# Patient Record
Sex: Male | Born: 1985 | Race: White | Hispanic: No | Marital: Married | Smoking: Never smoker
Health system: Southern US, Community
[De-identification: ages and names within clinical notes are randomized; demographics above are authoritative.]

## PROBLEM LIST (undated history)

## (undated) DIAGNOSIS — K2970 Gastritis, unspecified, without bleeding: Secondary | ICD-10-CM

## (undated) DIAGNOSIS — T7840XA Allergy, unspecified, initial encounter: Secondary | ICD-10-CM

## (undated) DIAGNOSIS — D649 Anemia, unspecified: Secondary | ICD-10-CM

## (undated) HISTORY — DX: Anemia, unspecified: D64.9

## (undated) HISTORY — DX: Gastritis, unspecified, without bleeding: K29.70

## (undated) HISTORY — DX: Allergy, unspecified, initial encounter: T78.40XA

## (undated) HISTORY — PX: ADENOIDECTOMY: SUR15

---

## 2019-07-20 ENCOUNTER — Emergency Department (HOSPITAL_COMMUNITY): Payer: Self-pay | Attending: Student

## 2019-07-20 ENCOUNTER — Other Ambulatory Visit: Payer: Self-pay

## 2019-07-20 ENCOUNTER — Emergency Department (HOSPITAL_COMMUNITY): Payer: No Typology Code available for payment source

## 2019-07-20 ENCOUNTER — Encounter (HOSPITAL_COMMUNITY): Payer: Self-pay | Admitting: Emergency Medicine

## 2019-07-20 ENCOUNTER — Emergency Department (HOSPITAL_COMMUNITY)
Admission: EM | Admit: 2019-07-20 | Discharge: 2019-07-21 | Disposition: A | Discharge status: Home / Self Care | Payer: No Typology Code available for payment source | Attending: Emergency Medicine | Admitting: Emergency Medicine

## 2019-07-20 DIAGNOSIS — W010XXA Fall on same level from slipping, tripping and stumbling without subsequent striking against object, initial encounter: Secondary | ICD-10-CM | POA: Diagnosis not present

## 2019-07-20 DIAGNOSIS — Y9389 Activity, other specified: Secondary | ICD-10-CM | POA: Diagnosis not present

## 2019-07-20 DIAGNOSIS — S52122A Displaced fracture of head of left radius, initial encounter for closed fracture: Secondary | ICD-10-CM | POA: Diagnosis not present

## 2019-07-20 DIAGNOSIS — Y929 Unspecified place or not applicable: Secondary | ICD-10-CM | POA: Diagnosis not present

## 2019-07-20 DIAGNOSIS — S59912A Unspecified injury of left forearm, initial encounter: Secondary | ICD-10-CM | POA: Diagnosis present

## 2019-07-20 DIAGNOSIS — R52 Pain, unspecified: Secondary | ICD-10-CM

## 2019-07-20 DIAGNOSIS — Y998 Other external cause status: Secondary | ICD-10-CM | POA: Insufficient documentation

## 2019-07-20 MED ORDER — HYDROCODONE-ACETAMINOPHEN 5-325 MG PO TABS: 1.0000 | ORAL_TABLET | Freq: Four times a day (QID) | ORAL | 0 refills | Status: DC | PRN

## 2019-07-20 MED ORDER — HYDROCODONE-ACETAMINOPHEN 5-325 MG PO TABS
1.0000 | ORAL_TABLET | Freq: Once | ORAL | Status: AC
  Administered 2019-07-21: 1 via ORAL
  Filled 2019-07-20: qty 1

## 2019-07-20 NOTE — Discharge Instructions (Addendum)
Take ibuprofen 3 times a day with meals.  Do not take other anti-inflammatories at the same time (Advil, Motrin, naproxen, Aleve). You may supplement with Tylenol if you need further pain control. Use ice packs, 20 minutes at a time, 3 times a day.  Take norco as needed for severe or break through pain. Have caution, this may make you tired or groggy. Do not driver or operate heavy machinery.  Follow up with the clinic listed below.

## 2019-07-20 NOTE — ED Triage Notes (Signed)
Pt c/o left elbow pain and concerned for dislocation after falling at work and trying to catch himself.

## 2019-07-21 NOTE — ED Notes (Signed)
Patient verbalizes understanding of discharge instructions. Opportunity for questioning and answers were provided. Armband removed by staff, pt discharged from ED.  

## 2019-07-21 NOTE — ED Provider Notes (Signed)
[facility] EMERGENCY DEPARTMENT Provider Note   CSN: [id number] Arrival date & time: 07/20/19  2051     History   Chief Complaint Chief Complaint  Patient presents with  . Arm Injury    HPI Kirk James is a 33 y.o. male presenting for evaluation of L elbow and wrist pain.   Patient states around 8:00 tonight he tripped backwards, and stretched his left arm backwards to catch himself.  Since then, he has been having pain of his left elbow and at the base of his left thumb on the palmar aspect.  He denies pain elsewhere.  He denies numbness or tingling.  Pain is mild in his hand/wrist, worse in his elbow.  He has not taken anything for pain including Tylenol ibuprofen.  He denies injury elsewhere.  He denies hitting his head or loss of consciousness.  He is right-handed.  He has no medical problems, takes no medications daily.  He has never seen an orthopedic doctor before.     HPI  History reviewed. No pertinent past medical history.  There are no active problems to display for this patient.   History reviewed. No pertinent surgical history.      Home Medications    Prior to Admission medications   Medication Sig Start Date End Date Taking? Authorizing Provider  HYDROcodone-acetaminophen (NORCO/VICODIN) 5-325 MG tablet Take 1 tablet by mouth every 6 (six) hours as needed for severe pain. 07/20/19   Mattheo Swindle, PA-C    Family History No family history on file.  Social History Social History   Tobacco Use  . Smoking status: Never Smoker  . Smokeless tobacco: Never Used  Substance Use Topics  . Alcohol use: Yes  . Drug use: Never     Allergies   Patient has no known allergies.   Review of Systems Review of Systems  Musculoskeletal: Positive for arthralgias and joint swelling.     Physical Exam Updated Vital Signs BP (!) 143/78 (BP Location: Right Arm)   Pulse 70   Temp 98.8 F (37.1 C) (Oral)   Ht 5\' 9"  (1.753 m)   Wt 90.7  kg   SpO2 96%   BMI 29.53 kg/m   Physical Exam Vitals signs and nursing note reviewed.  Constitutional:      General: He is not in acute distress.    Appearance: He is well-developed.  HENT:     Head: Normocephalic and atraumatic.  Neck:     Musculoskeletal: Normal range of motion.  Pulmonary:     Effort: Pulmonary effort is normal.  Abdominal:     General: There is no distension.  Musculoskeletal:        General: Swelling and tenderness present.       Hands:     Comments: Mild swelling of the left elbow.  Tenderness palpation of the entire elbow joint.  No erythema or warmth.  No tenderness palpation of the forearm.  Tenderness palpation at the base of the left thumb, over the thenar eminence.  No tenderness palpation of the dorsal thumb or pain at the anatomic snuffbox.  Full active range of motion of the thumb with mild discomfort.  Radial pulses intact bilaterally.  Skin:    General: Skin is warm.     Findings: No rash.  Neurological:     Mental Status: He is alert and oriented to person, place, and time.      ED Treatments / Results  Labs (all labs ordered are listed, but  only abnormal results are displayed) Labs Reviewed - No data to display  EKG None  Radiology Dg Elbow Complete Left  Result Date: 07/20/2019 CLINICAL DATA:  Fall.  Elbow pain EXAM: LEFT ELBOW - COMPLETE 3+ VIEW COMPARISON:  None. FINDINGS: Fracture of the radial head with mild displacement. Large joint effusion. No other fracture. IMPRESSION: Radial head fracture Electronically Signed   By: Marlan Palau M.D.   On: 07/20/2019 21:26   Dg Wrist Complete Right  Addendum Date: 07/20/2019   ADDENDUM REPORT: 07/20/2019 23:25 ADDENDUM: The examination is actually of the left wrist. Electronically Signed   By: Deatra Robinson M.D.   On: 07/20/2019 23:25   Result Date: 07/20/2019 CLINICAL DATA:  Wrist pain after falling EXAM: RIGHT WRIST - COMPLETE 3+ VIEW COMPARISON:  None. FINDINGS: There is no evidence  of fracture or dislocation. There is no evidence of arthropathy or other focal bone abnormality. Soft tissues are unremarkable. IMPRESSION: Negative. Electronically Signed: By: Deatra Robinson M.D. On: 07/20/2019 22:52    Procedures Procedures (including critical care time)  Medications Ordered in ED Medications  HYDROcodone-acetaminophen (NORCO/VICODIN) 5-325 MG per tablet 1 tablet (has no administration in time range)     Initial Impression / Assessment and Plan / ED Course  I have reviewed the triage vital signs and the nursing notes.  Pertinent labs & imaging results that were available during my care of the patient were reviewed by me and considered in my medical decision making (see chart for details).        Pt presenting for evaluation of L elbow and wrist pain s/p fall.  Physical exam reassuring, he is neurovascularly intact.  However, pain of the elbow and at the base of the right thumb.  As such, will obtain x-rays of both.  X-rays viewed interpreted by me, shows nondisplaced radial head fracture.  No abnormality of the wrist.  Of note, right wrist x-ray was ordered, however left wrist imaging was performed.  This is where the patient was injured, as such we will order name is incorrect with the image is of the correct side.  As she is mildly displaced, will consult with orthopedics, however patient can likely be discharged.  Discussed with Dr. Charlann Boxer from orthopedics who recommends sling and pain control, and follow-up with the hand surgeons in his group.  Discussed findings and plan with patient, who is agreeable.  PMP checked, patient without concerning controlled substance use. Will give norco. F/u with ortho. At this time, pt appears safe for d/c. Return precautions given. Pt states he understands and agrees to plan.   Final Clinical Impressions(s) / ED Diagnoses   Final diagnoses:  Closed displaced fracture of head of left radius, initial encounter    ED Discharge Orders          Ordered    HYDROcodone-acetaminophen (NORCO/VICODIN) 5-325 MG tablet  Every 6 hours PRN     07/20/19 2345           Rasheena Talmadge, PA-C 07/21/19 0122    Melene Plan, DO 07/21/19 1521

## 2020-03-05 IMAGING — DX DG WRIST COMPLETE 3+V*R*
4 series · 4 of 4 positions shown · non-contrast
Comparison: None.
COMPARISON: None.

Addendum:
CLINICAL DATA: Wrist pain after falling

EXAM:
RIGHT WRIST - COMPLETE 3+ VIEW

[wrist pa]
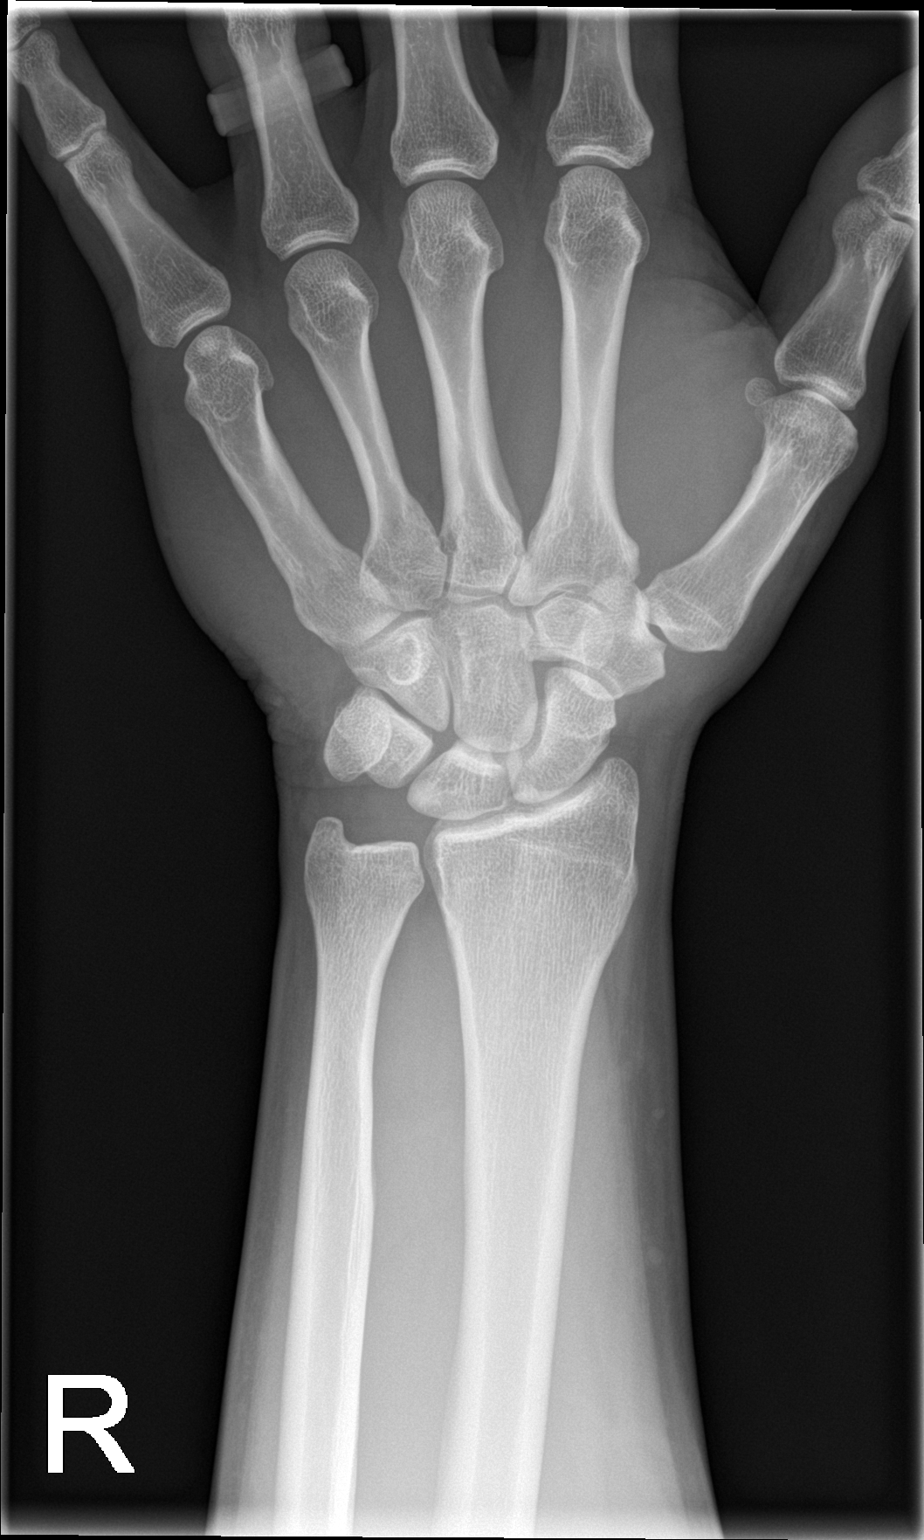

[wrist obl]
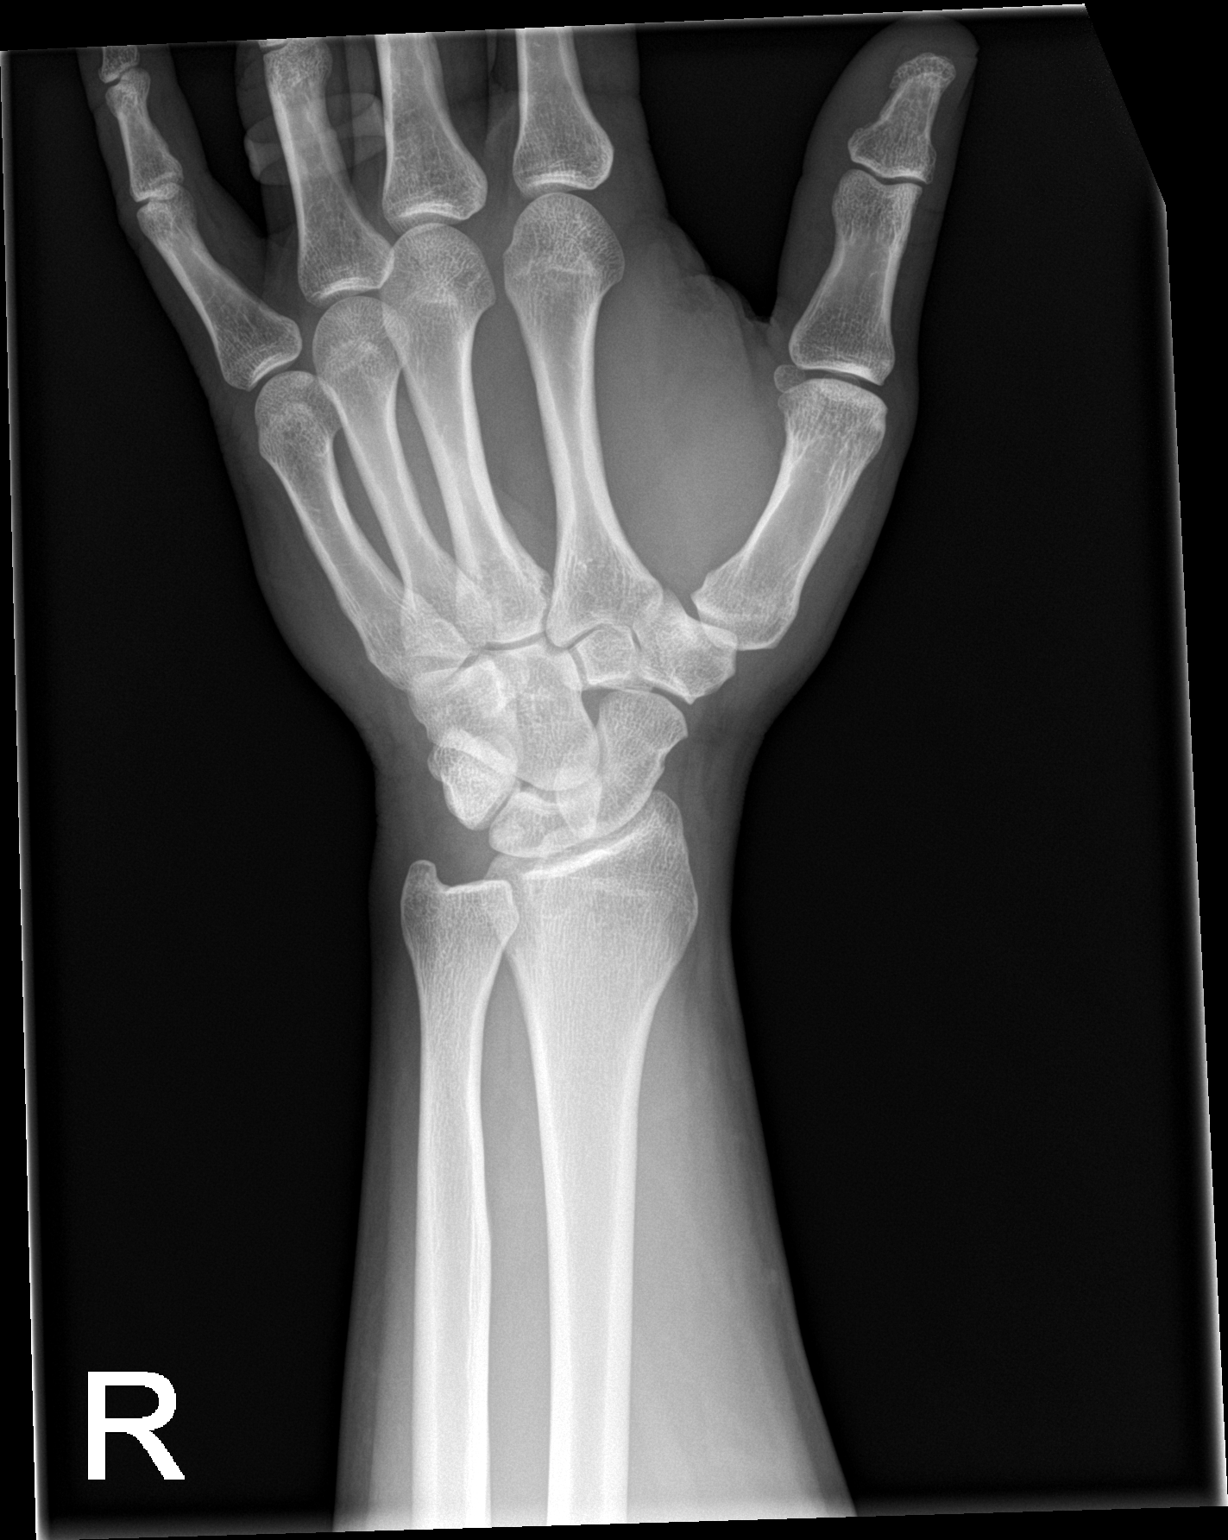

[wrist lat]
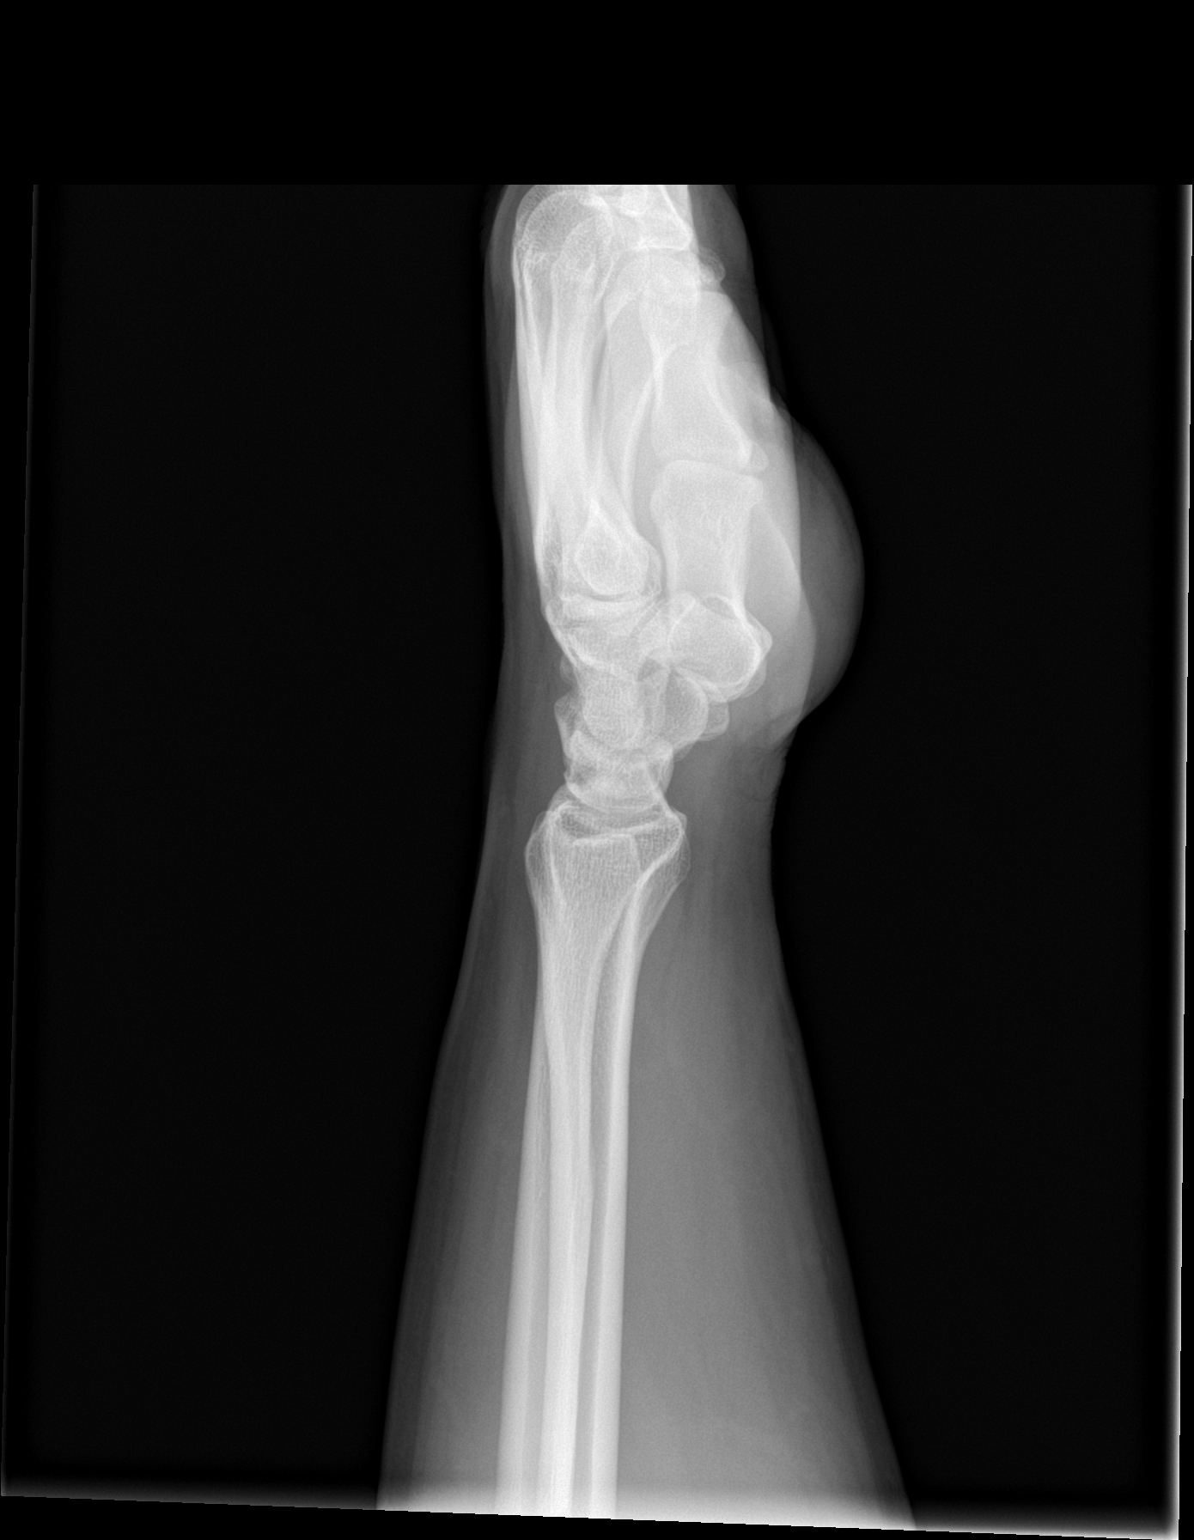

[wrist navicular]
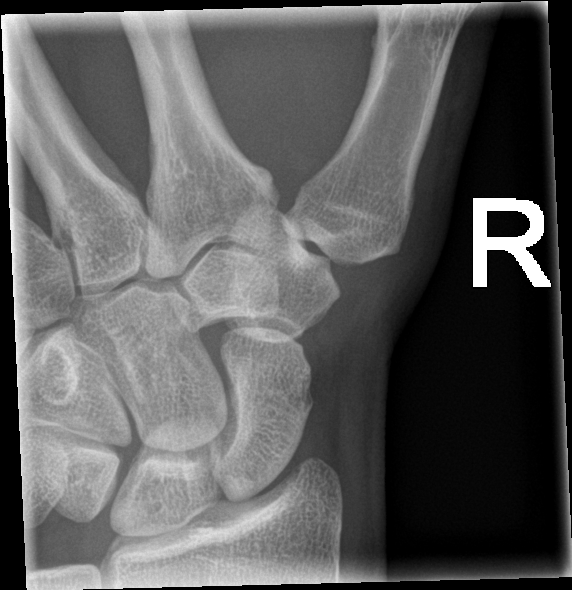

[4 of 4 positions shown; findings below may reference images not displayed]

FINDINGS: There is no evidence of fracture or dislocation. There is no
evidence of arthropathy or other focal bone abnormality. Soft
tissues are unremarkable.
IMPRESSION: Negative.

ADDENDUM:
The examination is actually of the left wrist.

*** End of Addendum ***
FINDINGS: There is no evidence of fracture or dislocation. There is no
evidence of arthropathy or other focal bone abnormality. Soft
tissues are unremarkable.
IMPRESSION: Negative.

## 2021-11-16 DIAGNOSIS — K838 Other specified diseases of biliary tract: Secondary | ICD-10-CM | POA: Insufficient documentation

## 2022-03-26 ENCOUNTER — Ambulatory Visit (INDEPENDENT_AMBULATORY_CARE_PROVIDER_SITE_OTHER): Payer: Managed Care, Other (non HMO) | Admitting: Family Medicine

## 2022-03-26 ENCOUNTER — Encounter: Payer: Self-pay | Admitting: Family Medicine

## 2022-03-26 VITALS — BP 116/78 | HR 70 | Temp 97.0°F | Ht 69.0 in | Wt 228.2 lb

## 2022-03-26 DIAGNOSIS — K2950 Unspecified chronic gastritis without bleeding: Secondary | ICD-10-CM | POA: Diagnosis not present

## 2022-03-26 DIAGNOSIS — K838 Other specified diseases of biliary tract: Secondary | ICD-10-CM

## 2022-03-26 DIAGNOSIS — Z302 Encounter for sterilization: Secondary | ICD-10-CM

## 2022-03-26 DIAGNOSIS — K2970 Gastritis, unspecified, without bleeding: Secondary | ICD-10-CM | POA: Insufficient documentation

## 2022-03-26 NOTE — Assessment & Plan Note (Signed)
Has followed with GI in the past Asymptomatic Continue monitor

## 2022-03-26 NOTE — Progress Notes (Signed)
New Patient Office Visit  Subjective    Patient ID: Kirk James, male    DOB: 12/04/1985  Age: 36 y.o. MRN: [id number]  CC:  Chief Complaint  Patient presents with   Establish Care    Referral for Vasectomy     HPI Kirk James presents to establish care.   Patient presents with a history of gastritis and common bile duct dilatation. In February patient also had CMP with mildly elevated transaminases, Lipase and CBC within normal limits. Had significant work-up with GI including normal HIDA scan and EGD with multiple biopsies that did not show malignancy or H. pylori.  Patient reports complete resolution of symptoms over the past 2 months.    Patient sexually active monogamous relationship.  Practices safe sex, however's wanting a referral to urology for vasectomy.  Outpatient Encounter Medications as of 03/26/2022  Medication Sig   [DISCONTINUED] HYDROcodone-acetaminophen (NORCO/VICODIN) 5-325 MG tablet Take 1 tablet by mouth every 6 (six) hours as needed for severe pain. (Patient not taking: Reported on 03/26/2022)   No facility-administered encounter medications on file as of 03/26/2022.    Past Medical History:  Diagnosis Date   Allergy    Anemia    Gastritis     Past Surgical History:  Procedure Laterality Date   ADENOIDECTOMY Bilateral     History reviewed. No pertinent family history.  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,  Photographer    Social Determinants of Health   Financial Resource Strain: Not on file  Food Insecurity: Not on file  Transportation Needs: Not on file  Physical Activity: Not on file  Stress: Not on file  Social Connections: Not on file  Intimate Partner Violence: Not on file    ROS No GI symptoms, no chest pain or shortness of breath emainder of review of systems negative     Objective    BP 116/78 (BP Location: Left Arm, Patient Position: Sitting, Cuff Size: Large)   Pulse 70   Temp (!) 97 F (36.1 C) (Temporal)   Ht 5\' 9"  (1.753 m)   Wt 228 lb 3.2 oz (103.5 kg)   SpO2 98%   BMI 33.70 kg/m   Gen: NAD, resting comfortably CV: RRR with no murmurs appreciated Pulm: NWOB, CTAB with no crackles, wheezes, or rhonchi GI: Normal bowel sounds present. Soft, Nontender, Nondistended. MSK: no edema, cyanosis, or clubbing noted Skin: warm, dry Neuro: grossly normal, moves all extremities Psych: Normal affect and thought content       Assessment & Plan:  Briefly discussed the benefits of vasectomy, and associated my general understanding of the procedure, however recommended follow-up with urology for further information   Problem List Items Addressed This Visit       Digestive   Gastritis   Other Visit Diagnoses     Encounter for sterilization  in male    -  Primary   Relevant Orders   Ambulatory referral to Urology       Return in about 6 months (around 09/25/2022) for physical .   Garnette Gunner, MD

## 2022-03-26 NOTE — Assessment & Plan Note (Signed)
Asymptomatic Repeat CMP to confirm of LFTs returned to normal

## 2022-08-15 HISTORY — PX: VASECTOMY: SHX75

## 2023-06-10 ENCOUNTER — Ambulatory Visit (INDEPENDENT_AMBULATORY_CARE_PROVIDER_SITE_OTHER): Payer: Managed Care, Other (non HMO) | Admitting: Family Medicine

## 2023-06-10 ENCOUNTER — Encounter: Payer: Self-pay | Admitting: Family Medicine

## 2023-06-10 VITALS — BP 122/74 | HR 79 | Temp 97.1°F | Ht 69.0 in | Wt 220.0 lb

## 2023-06-10 DIAGNOSIS — E669 Obesity, unspecified: Secondary | ICD-10-CM

## 2023-06-10 DIAGNOSIS — K760 Fatty (change of) liver, not elsewhere classified: Secondary | ICD-10-CM

## 2023-06-10 DIAGNOSIS — Z6832 Body mass index (BMI) 32.0-32.9, adult: Secondary | ICD-10-CM

## 2023-06-10 DIAGNOSIS — Z0000 Encounter for general adult medical examination without abnormal findings: Secondary | ICD-10-CM | POA: Diagnosis not present

## 2023-06-10 DIAGNOSIS — Z23 Encounter for immunization: Secondary | ICD-10-CM

## 2023-06-10 DIAGNOSIS — K2950 Unspecified chronic gastritis without bleeding: Secondary | ICD-10-CM

## 2023-06-10 LAB — LIPID PANEL
Cholesterol: 161 mg/dL (ref 0–200)
HDL: 39.3 mg/dL (ref >39.00)
LDL Cholesterol: 84 mg/dL (ref 0–99)
NonHDL: 121.31
Total CHOL/HDL Ratio: 4
Triglycerides: 188 mg/dL — ABNORMAL HIGH (ref 0.0–149.0)
VLDL: 37.6 mg/dL (ref 0.0–40.0)

## 2023-06-10 LAB — CBC WITH DIFFERENTIAL/PLATELET
Basophils Absolute: 0 10*3/uL (ref 0.0–0.1)
Basophils Relative: 0.4 % (ref 0.0–3.0)
Eosinophils Absolute: 0.1 10*3/uL (ref 0.0–0.7)
Eosinophils Relative: 1.2 % (ref 0.0–5.0)
HCT: 46.1 % (ref 39.0–52.0)
Hemoglobin: 15.5 g/dL (ref 13.0–17.0)
Lymphocytes Relative: 23.2 % (ref 12.0–46.0)
Lymphs Abs: 1.9 10*3/uL (ref 0.7–4.0)
MCHC: 33.6 g/dL (ref 30.0–36.0)
MCV: 84.5 fl (ref 78.0–100.0)
Monocytes Absolute: 0.6 10*3/uL (ref 0.1–1.0)
Monocytes Relative: 7.7 % (ref 3.0–12.0)
Neutro Abs: 5.6 10*3/uL (ref 1.4–7.7)
Neutrophils Relative %: 67.5 % (ref 43.0–77.0)
Platelets: 216 10*3/uL (ref 150.0–400.0)
RBC: 5.46 Mil/uL (ref 4.22–5.81)
RDW: 12.8 % (ref 11.5–15.5)
WBC: 8.3 10*3/uL (ref 4.0–10.5)

## 2023-06-10 LAB — COMPREHENSIVE METABOLIC PANEL
ALT: 23 U/L (ref 0–53)
AST: 14 U/L (ref 0–37)
Albumin: 4.3 g/dL (ref 3.5–5.2)
Alkaline Phosphatase: 60 U/L (ref 39–117)
BUN: 14 mg/dL (ref 6–23)
CO2: 27 mEq/L (ref 19–32)
Calcium: 9.4 mg/dL (ref 8.4–10.5)
Chloride: 103 mEq/L (ref 96–112)
Creatinine, Ser: 0.94 mg/dL (ref 0.40–1.50)
GFR: 102.87 mL/min (ref >60.00)
Glucose, Bld: 106 mg/dL — ABNORMAL HIGH (ref 70–99)
Potassium: 3.7 mEq/L (ref 3.5–5.1)
Sodium: 139 mEq/L (ref 135–145)
Total Bilirubin: 1.3 mg/dL — ABNORMAL HIGH (ref 0.2–1.2)
Total Protein: 7 g/dL (ref 6.0–8.3)

## 2023-06-10 LAB — TSH: TSH: 0.67 u[IU]/mL (ref 0.35–5.50)

## 2023-06-10 LAB — MICROALBUMIN / CREATININE URINE RATIO
Creatinine,U: 247.3 mg/dL
Microalb Creat Ratio: 0.3 mg/g (ref 0.0–30.0)
Microalb, Ur: 0.8 mg/dL (ref 0.0–1.9)

## 2023-06-10 LAB — HEMOGLOBIN A1C: Hgb A1c MFr Bld: 5.6 % (ref 4.6–6.5)

## 2023-06-10 NOTE — Progress Notes (Signed)
Assessment  Assessment/Plan:   Problem List Items Addressed This Visit       Digestive   Gastritis   Relevant Orders   TSH   Lipid panel   Hemoglobin A1c   Microalbumin / creatinine urine ratio   CBC with Differential/Platelet   Comprehensive metabolic panel   Urinalysis w microscopic + reflex cultur   Metabolic dysfunction-associated steatotic liver disease (MASLD)   Relevant Orders   TSH   Lipid panel   Hemoglobin A1c   Microalbumin / creatinine urine ratio   CBC with Differential/Platelet   Comprehensive metabolic panel   Urinalysis w microscopic + reflex cultur   HCV Ab w Reflex to Quant PCR   HIV Antibody (routine testing w rflx)   Hepatitis B surface antigen   Other Visit Diagnoses     Encounter for well adult exam without abnormal findings    -  Primary   Relevant Orders   TSH   Lipid panel   Hemoglobin A1c   Microalbumin / creatinine urine ratio   CBC with Differential/Platelet   Comprehensive metabolic panel   Urinalysis w microscopic + reflex cultur   Class 1 obesity without serious comorbidity with body mass index (BMI) of 32.0 to 32.9 in adult, unspecified obesity type       Relevant Orders   TSH   Lipid panel   Hemoglobin A1c   Microalbumin / creatinine urine ratio   CBC with Differential/Platelet   Comprehensive metabolic panel   Urinalysis w microscopic + reflex cultur   Immunization due       Relevant Orders   Tdap vaccine greater than or equal to 7yo IM (Completed)      Perform blood tests including complete metabolic panel, liver function tests, lipid panel, and A1C for diabetes screening. Conduct urinalysis to check for any proteinuria, which can also give insight into liver/kidney health. Recommend hepatitis screening (B and C) due to the history of liver pathology. Administer a tetanus booster shot today. Verify and update vaccine status, including seasonally appropriate flu and COVID-19 vaccines. Emphasize importance of maintaining  current dietary and lifestyle habits, particularly intermittent fasting and moderation in alcohol intake. Advise regular physical exercise and recreational sports.  There are no discontinued medications.  Patient Counseling(The following topics were reviewed and/or handout was given):  -Nutrition: Stressed importance of moderation in sodium/caffeine intake, saturated fat and cholesterol, caloric balance, sufficient intake of fresh fruits, vegetables, and fiber.  -Stressed the importance of regular exercise.   -Substance Abuse: Discussed cessation/primary prevention of tobacco, alcohol, or other drug use; driving or other dangerous activities under the influence; availability of treatment for abuse.   -Injury prevention: Discussed safety belts, safety helmets, smoke detector, smoking near bedding or upholstery.   -Sexuality: Discussed sexually transmitted diseases, partner selection, use of condoms, avoidance of unintended pregnancy and contraceptive alternatives.   -Dental health: Discussed importance of regular tooth brushing, flossing, and dental visits.  -Health maintenance and immunizations reviewed. Please refer to Health maintenance section.  Return to care in 1 year for next preventative visit.       Subjective:  Chief complaint Encounter date: 06/10/2023  Chief Complaint  Patient presents with   Annual Exam    Non fasting   Kirk James is a 37 y.o. male who presents today for his annual comprehensive physical exam.    History of Present Illness: Kirk James, a 37 year old male, returns for his annual physical examination. The patient previously experienced gastritis and chest pains, which were  followed with gastrointestinal workup. He reports that these symptoms have considerably improved, occurring very infrequently, about once every few weeks, often triggered by excessive eating at night. His last significant chest pain episode was several weeks ago. He underwent a vasectomy  in November with no associated complications.  Lifestyle:  Diet: Patient has been practicing intermittent fasting (11 am to 7 pm); tries to avoid excessive eating at night, and limits fats intake. Mild alcohol consumption (approximately 3-4 drinks per month). Exercise: Physical activity is primarily derived from a physically demanding job (approximately 6-7 miles of walking daily); recreational activities include bowling and golfing.  Review of Systems  Constitutional:  Negative for chills, diaphoresis, fever, malaise/fatigue and weight loss.  HENT:  Negative for congestion, ear discharge, ear pain and hearing loss.   Eyes:  Negative for blurred vision, double vision, photophobia, pain, discharge and redness.  Respiratory:  Negative for cough, sputum production, shortness of breath and wheezing.   Cardiovascular:  Negative for chest pain and palpitations.  Gastrointestinal:  Positive for heartburn. Negative for abdominal pain, blood in stool, constipation, diarrhea, melena, nausea and vomiting.  Genitourinary:  Negative for dysuria, flank pain, frequency, hematuria and urgency.  Musculoskeletal:  Negative for myalgias.  Skin:  Negative for itching and rash.  Neurological:  Negative for dizziness, tingling, tremors, speech change, seizures, loss of consciousness, weakness and headaches.  Psychiatric/Behavioral:  Negative for depression, hallucinations, memory loss, substance abuse and suicidal ideas. The patient does not have insomnia.   All other systems reviewed and are negative.      06/10/2023    1:27 PM  GAD-7 Generalized Anxiety Disorder Screening Tool  1. Feeling Nervous, Anxious, or on Edge 0  2. Not Being Able to Stop or Control Worrying 0  3. Worrying Too Much About Different Things 0  4. Trouble Relaxing 0  5. Being So Restless it's Hard To Sit Still 0  6. Becoming Easily Annoyed or Irritable 0  7. Feeling Afraid As If Something Awful Might Happen 0  Total GAD-7 Score 0   Difficulty At Work, Home, or Getting  Along With Others? Not difficult at all      06/10/2023    1:27 PM  Depression screen PHQ 2/9  Decreased Interest 0  Down, Depressed, Hopeless 0  PHQ - 2 Score 0  Altered sleeping 3  Tired, decreased energy 0  Change in appetite 0  Feeling bad or failure about yourself  0  Trouble concentrating 0  Moving slowly or fidgety/restless 0  Suicidal thoughts 0  PHQ-9 Score 3  Difficult doing work/chores Not difficult at all    Health Maintenance Due  Topic Date Due   HIV Screening  Never done   Hepatitis C Screening  Never done    PMH:  The following were reviewed and entered/updated in epic: Past Medical History:  Diagnosis Date   Allergy    Anemia    Gastritis     Patient Active Problem List   Diagnosis Date Noted   Metabolic dysfunction-associated steatotic liver disease (MASLD) 06/10/2023   Gastritis 03/26/2022   Common bile duct dilatation 11/16/2021    Past Surgical History:  Procedure Laterality Date   ADENOIDECTOMY Bilateral    VASECTOMY  08/2022    History reviewed. No pertinent family history.  Medications- reviewed and updated No outpatient medications prior to visit.   No facility-administered medications prior to visit.    No Known Allergies  Social History   Socioeconomic History   Marital status: Married  Spouse name: Not on file   Number of children: Not on file   Years of education: Not on file   Highest education level: Not on file  Occupational History   Occupation: [employer]  Tobacco Use   Smoking status: Never    Passive exposure: Never   Smokeless tobacco: Never  Vaping Use   Vaping status: Never Used  Substance and Sexual Activity   Alcohol use: Yes    Alcohol/week: 1.0 standard drink of alcohol    Types: 1 Cans of beer per week   Drug use: Never   Sexual activity: Yes  Other Topics Concern   Not on file  Social History Narrative   Spouse: Reuel Boom   Married 3 years, in 2023    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 Wt Readings from Last 3 Encounters:  06/10/23 220 lb (99.8 kg)  03/26/22 228 lb 3.2 oz (103.5 kg)  07/20/19 200 lb (90.7 kg)    Physical Exam Constitutional:      General: He is not in acute distress.    Appearance: Normal appearance. He is not ill-appearing or toxic-appearing.  HENT:     Head: Normocephalic and atraumatic.     Right Ear: Hearing, tympanic membrane, ear canal and external ear normal. There is no impacted cerumen.     Left Ear: Hearing, tympanic membrane, ear canal and external ear normal. There is no impacted cerumen.     Nose: Nose normal. No congestion.     Mouth/Throat:     Lips: No lesions.     Mouth: Mucous membranes are moist.     Pharynx: Oropharynx is clear. No oropharyngeal exudate.  Eyes:     General: No scleral icterus.       Right eye: No discharge.        Left eye: No discharge.     Conjunctiva/sclera: Conjunctivae normal.     Pupils: Pupils are equal, round, and reactive to light.  Neck:     Thyroid: No thyroid mass, thyromegaly or thyroid tenderness.  Cardiovascular:     Rate and Rhythm: Normal rate and regular rhythm.     Pulses: Normal pulses.     Heart sounds: Normal heart sounds.  Pulmonary:     Effort: Pulmonary effort is normal. No respiratory distress.     Breath sounds: Normal breath sounds.  Abdominal:     General: Abdomen is flat. Bowel sounds are normal.     Palpations: Abdomen is soft.  Musculoskeletal:         General: Normal range of motion.     Cervical back: Normal range of motion.     Right lower leg: No edema.     Left lower leg: No edema.  Lymphadenopathy:     Cervical: No cervical adenopathy.  Skin:    General: Skin is warm and dry.  Findings: No rash.  Neurological:     General: No focal deficit present.     Mental Status: He is alert and oriented to person, place, and time. Mental status is at baseline.     Deep Tendon Reflexes:     Reflex Scores:      Patellar reflexes are 2+ on the right side and 2+ on the left side. Psychiatric:        Mood and Affect: Mood normal.        Behavior: Behavior normal.        Thought Content: Thought content normal.        Judgment: Judgment normal.         At today's visit, we discussed treatment options, associated risk and benefits, and engage in counseling as needed.  Additionally the following were reviewed: Past medical records, past medical and surgical history, family and social background, as well as relevant laboratory results, imaging findings, and specialty notes, where applicable.  This message was generated using dictation software, and as a result, it may contain unintentional typos or errors.  Nevertheless, extensive effort was made to accurately convey at the pertinent aspects of the patient visit.    There may have been are other unrelated non-urgent complaints, but due to the busy schedule and the amount of time already spent with him, time does not permit to address these issues at today's visit. Another appointment may have or has been requested to review these additional issues.   Thomes Dinning, MD, MS

## 2023-06-11 LAB — URINALYSIS W MICROSCOPIC + REFLEX CULTURE
Bacteria, UA: NONE SEEN /HPF
Bilirubin Urine: NEGATIVE
Glucose, UA: NEGATIVE
Hgb urine dipstick: NEGATIVE
Hyaline Cast: NONE SEEN /LPF
Ketones, ur: NEGATIVE
Leukocyte Esterase: NEGATIVE
Nitrites, Initial: NEGATIVE
Protein, ur: NEGATIVE
RBC / HPF: NONE SEEN /HPF (ref 0–2)
Specific Gravity, Urine: 1.026 (ref 1.001–1.035)
Squamous Epithelial / HPF: NONE SEEN /HPF (ref <=5)
WBC, UA: NONE SEEN /HPF (ref 0–5)
pH: 6 (ref 5.0–8.0)

## 2023-06-11 LAB — HCV AB W REFLEX TO QUANT PCR: HCV Ab: NONREACTIVE

## 2023-06-11 LAB — HCV INTERPRETATION

## 2023-06-11 LAB — NO CULTURE INDICATED

## 2023-06-11 LAB — HIV ANTIBODY (ROUTINE TESTING W REFLEX): HIV 1&2 Ab, 4th Generation: NONREACTIVE

## 2023-06-11 LAB — HEPATITIS B SURFACE ANTIGEN: Hepatitis B Surface Ag: NONREACTIVE

## 2024-02-24 ENCOUNTER — Ambulatory Visit (INDEPENDENT_AMBULATORY_CARE_PROVIDER_SITE_OTHER): Admitting: Family Medicine

## 2024-02-24 ENCOUNTER — Encounter: Payer: Self-pay | Admitting: Family Medicine

## 2024-02-24 VITALS — HR 64 | Temp 97.7°F | Resp 18 | Wt 226.6 lb

## 2024-02-24 DIAGNOSIS — M7711 Lateral epicondylitis, right elbow: Secondary | ICD-10-CM | POA: Diagnosis not present

## 2024-02-24 MED ORDER — MELOXICAM 15 MG PO TABS: 15.0000 mg | ORAL_TABLET | Freq: Every day | ORAL | 0 refills | Status: DC

## 2024-02-24 MED ORDER — ELBOW BRACE MISC: 0 refills | Status: AC

## 2024-02-24 NOTE — Patient Instructions (Signed)
  VISIT SUMMARY: You came in today because of pain in your right elbow that has been bothering you for about nine weeks. The pain started after a bowling session and gets worse when you lift heavy objects or squeeze your hand. You have not noticed any bruising or swelling, and you have been resting the elbow without much improvement.  YOUR PLAN: -LATERAL EPICONDYLITIS (TENNIS ELBOW): Lateral epicondylitis, commonly known as tennis elbow, is a condition where the tendons in your elbow are overloaded, usually by repetitive motions of the wrist and arm. To help manage this, we will start with an x-ray of your right elbow to rule out a stress fracture. You should follow the home exercise instructions provided to help rehabilitate your elbow. Over-the-counter pain relievers like ibuprofen or acetaminophen  can be used for pain management. Additionally, using an elbow brace or compression sleeve during activities can provide support. I have also prescribed meloxicam 15 mg to be taken as needed for pain during long sessions or increased activity. If your symptoms do not improve, please follow up in one month for a possible referral to formal physical therapy.  INSTRUCTIONS: Please get an x-ray of your right elbow as soon as possible. Follow the home exercise instructions provided and use over-the-counter pain relievers as needed. Consider using an elbow brace or compression sleeve during activities. Take meloxicam 15 mg as needed for pain. Follow up in one month if your symptoms persist for further evaluation and possible physical therapy referral.

## 2024-02-24 NOTE — Progress Notes (Signed)
 Assessment & Plan   Assessment/Plan:       Assessment and Plan Assessment & Plan Lateral epicondylitis (tennis elbow) Chronic right lateral epicondylitis persisting for nine weeks, with pain exacerbated by lifting heavy objects and certain movements, such as squeezing the hand. No pain at rest. Differential diagnosis includes stress fracture, medial epicondyle, bicep strain, pitchers elbow, but unlikely due to lack of bruising or swelling. No signs of infection or septic joint. Conservative management with rest has not resolved symptoms. - Order x-ray of the right elbow to rule out stress fracture. - Provide home exercise instructions for rehabilitation of lateral epicondylitis. - Recommend over-the-counter analgesics such as ibuprofen or acetaminophen  for pain management. - Advise obtaining an elbow brace or compression sleeve for support during activities. - Prescribe meloxicam 15 mg as needed for pain management during long sessions or increased activity. - Follow up in one month if symptoms persist for consideration of formal physical therapy.      There are no discontinued medications.  Return if symptoms worsen or fail to improve.        Subjective:   Encounter date: 02/24/2024  Kirk James is a 38 y.o. male who has Gastritis; Common bile duct dilatation; and Metabolic dysfunction-associated steatotic liver disease (MASLD) on their problem list..   He  has a past medical history of Allergy, Anemia, and Gastritis.Aniella Wandrey Aas   He presents with chief complaint of Pain (Pt c/o o right arm and elbow pain for 2 months due to bowling. Pt used OTC Tylenol . //HM- updated ) .   History of Present Illness Kirk James is a 38 year old male who presents with right elbow pain.  He has been experiencing right elbow pain for approximately nine weeks, which began after a bowling session and worsened with lifting heavy objects at work. The pain originates in the bicep area and is  exacerbated by activities such as holding a heavy bowling ball or squeezing his hand.  The pain is not constant and is primarily triggered by specific movements, such as lifting heavy objects or extending his arm. He can hold an eight-pound bowling ball without significant discomfort, but a twelve-pound ball exacerbates the pain. Squeezing his hand causes pain in the lower part of his hand.  There is no bruising or swelling in the affected area. He has not sought formal treatment but has taken aspirin occasionally for pain relief. He has not engaged in any stretching or rehabilitation exercises, opting instead to rest the elbow.  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 He is an avid bowler and golfer, and the pain has impacted his ability to participate in these activities. He has not bowled since the onset of the pain and is concerned about his ability to compete in an upcoming national tournament.  No pain in the left elbow or other joints. No swelling, bruising, or pain in other joints.     ROS  Past Surgical History:  Procedure Laterality Date   ADENOIDECTOMY Bilateral    VASECTOMY  08/2022    Outpatient Medications Prior to Visit  Medication Sig Dispense Refill   mometasone (ELOCON) 0.1 % ointment Apply topically.     No facility-administered medications prior to visit.    History reviewed. No pertinent family history.  Social History   Socioeconomic History   Marital status: Married    Spouse name: Not on file   Number of children: Not on file  Years of education: Not on file   Highest education level: Not on file  Occupational History   Occupation: [employer]  Tobacco Use   Smoking status: Never    Passive exposure: Never   Smokeless tobacco: Never  Vaping Use   Vaping status: Never Used  Substance and Sexual Activity   Alcohol use: Yes    Alcohol/week: 1.0 standard drink  of alcohol    Types: 1 Cans of beer per week   Drug use: Never   Sexual activity: Yes  Other Topics Concern   Not on file  Social History Narrative   Spouse: Bearl Limes   Married 3 years, in 2023   Daugher 39 yo, Photographer    Social Drivers of Corporate investment banker Strain: Not on [employer] Insecurity: Not on file  Transportation Needs: Not on file  Physical Activity: Not on file  Stress: Not on file  Social Connections: Not on file  Intimate Partner Violence: Not on file                                                                                                  Objective:  Physical Exam: Pulse 64   Temp 97.7 F (36.5 C) (Temporal)   Resp 18   Wt 226 lb 9.6 oz (102.8 kg)   SpO2 96%   BMI 33.46 kg/m    Physical Exam GENERAL: Alert, cooperative, well developed, no acute distress HEENT: Normocephalic, normal oropharynx, moist mucous membranes CHEST: Clear to auscultation bilaterally, no wheezes, rhonchi, or crackles CARDIOVASCULAR: Normal heart rate and rhythm, S1 and S2 normal without murmurs ABDOMEN: Soft, non-tender, non-distended, without organomegaly, normal bowel sounds EXTREMITIES: No cyanosis or edema MUSCULOSKELETAL: Pain on lateral elbow with certain movements, no pain on pronation or supination, negative Thomsen test NEUROLOGICAL: Cranial nerves grossly intact, moves all extremities without gross motor or sensory deficit     No results found.  No results found for this or any previous visit (from the past 2160 hours).      Carnell Christian, MD, MS

## 2024-06-12 ENCOUNTER — Ambulatory Visit (INDEPENDENT_AMBULATORY_CARE_PROVIDER_SITE_OTHER): Payer: Managed Care, Other (non HMO) | Admitting: Family Medicine

## 2024-06-12 ENCOUNTER — Encounter: Payer: Self-pay | Admitting: Family Medicine

## 2024-06-12 VITALS — BP 116/73 | HR 63 | Temp 97.2°F | Resp 18 | Wt 223.2 lb

## 2024-06-12 DIAGNOSIS — K760 Fatty (change of) liver, not elsewhere classified: Secondary | ICD-10-CM

## 2024-06-12 DIAGNOSIS — Z6832 Body mass index (BMI) 32.0-32.9, adult: Secondary | ICD-10-CM

## 2024-06-12 DIAGNOSIS — Z23 Encounter for immunization: Secondary | ICD-10-CM

## 2024-06-12 DIAGNOSIS — E66811 Obesity, class 1: Secondary | ICD-10-CM

## 2024-06-12 DIAGNOSIS — K2950 Unspecified chronic gastritis without bleeding: Secondary | ICD-10-CM | POA: Diagnosis not present

## 2024-06-12 DIAGNOSIS — Z0000 Encounter for general adult medical examination without abnormal findings: Secondary | ICD-10-CM | POA: Diagnosis not present

## 2024-06-12 DIAGNOSIS — M7711 Lateral epicondylitis, right elbow: Secondary | ICD-10-CM

## 2024-06-12 LAB — CBC WITH DIFFERENTIAL/PLATELET
Basophils Absolute: 0 K/uL (ref 0.0–0.1)
Basophils Relative: 0.3 % (ref 0.0–3.0)
Eosinophils Absolute: 0.1 K/uL (ref 0.0–0.7)
Eosinophils Relative: 1.1 % (ref 0.0–5.0)
HCT: 45.9 % (ref 39.0–52.0)
Hemoglobin: 15.5 g/dL (ref 13.0–17.0)
Lymphocytes Relative: 18.7 % (ref 12.0–46.0)
Lymphs Abs: 1.8 K/uL (ref 0.7–4.0)
MCHC: 33.7 g/dL (ref 30.0–36.0)
MCV: 83.6 fl (ref 78.0–100.0)
Monocytes Absolute: 0.6 K/uL (ref 0.1–1.0)
Monocytes Relative: 6.6 % (ref 3.0–12.0)
Neutro Abs: 6.9 K/uL (ref 1.4–7.7)
Neutrophils Relative %: 73.3 % (ref 43.0–77.0)
Platelets: 209 K/uL (ref 150.0–400.0)
RBC: 5.49 Mil/uL (ref 4.22–5.81)
RDW: 12.7 % (ref 11.5–15.5)
WBC: 9.5 K/uL (ref 4.0–10.5)

## 2024-06-12 LAB — LIPID PANEL
Cholesterol: 144 mg/dL (ref 0–200)
HDL: 39.9 mg/dL (ref >39.00)
LDL Cholesterol: 77 mg/dL (ref 0–99)
NonHDL: 104.05
Total CHOL/HDL Ratio: 4
Triglycerides: 133 mg/dL (ref 0.0–149.0)
VLDL: 26.6 mg/dL (ref 0.0–40.0)

## 2024-06-12 LAB — TSH: TSH: 0.65 u[IU]/mL (ref 0.35–5.50)

## 2024-06-12 LAB — COMPREHENSIVE METABOLIC PANEL WITH GFR
ALT: 21 U/L (ref 0–53)
AST: 17 U/L (ref 0–37)
Albumin: 4.5 g/dL (ref 3.5–5.2)
Alkaline Phosphatase: 50 U/L (ref 39–117)
BUN: 14 mg/dL (ref 6–23)
CO2: 27 meq/L (ref 19–32)
Calcium: 9.1 mg/dL (ref 8.4–10.5)
Chloride: 101 meq/L (ref 96–112)
Creatinine, Ser: 0.9 mg/dL (ref 0.40–1.50)
GFR: 107.61 mL/min (ref >60.00)
Glucose, Bld: 87 mg/dL (ref 70–99)
Potassium: 3.7 meq/L (ref 3.5–5.1)
Sodium: 138 meq/L (ref 135–145)
Total Bilirubin: 1.9 mg/dL — ABNORMAL HIGH (ref 0.2–1.2)
Total Protein: 7.5 g/dL (ref 6.0–8.3)

## 2024-06-12 LAB — HEMOGLOBIN A1C: Hgb A1c MFr Bld: 5.8 % (ref 4.6–6.5)

## 2024-06-12 MED ORDER — MELOXICAM 15 MG PO TABS: 15.0000 mg | ORAL_TABLET | Freq: Every day | ORAL | 0 refills | Status: AC

## 2024-06-12 NOTE — Progress Notes (Signed)
 Assessment  Assessment/Plan:  Assessment and Plan Assessment & Plan Adult Wellness Visit Routine adult wellness visit. Blood pressure and labs from last year were well-managed. Discussed upcoming colon cancer screening at age 38. - Perform physical examination - Administer hepatitis B and HPV vaccines  Obesity, class 1 Obesity with BMI of 32. Encouraged healthy lifestyle modifications including diet and exercise. - Recommend healthy diet with fruits and vegetables - Encourage cardiovascular exercise and weightlifting  Fatty liver Echogenic liver likely related to fatty liver. Previous ultrasound in 2023. Liver function tests were normal last year. Plan to monitor liver function and consider further imaging based on FIB-4 index results. - Order complete metabolic panel including liver function tests - Calculate FIB-4 index to assess need for further imaging - Consider ultrasound next year unless indicated sooner  Hypertriglyceridemia Mildly elevated triglycerides at 188 mg/dL last year, in the borderline high range. - Order fasting lipid panel - Order hemoglobin A1c to screen for diabetes  Right lateral epicondylitis Intermittent pain in the right elbow, exacerbated by bowling. Pain improved with meloxicam  and use of an elastic bandage support brace. - Continue meloxicam  as needed - Use elastic bandage support brace during activities - Consider referral to physical therapy if symptoms worsen  Suspected sleep apnea Reports frequent awakenings at night and occasional gasping for air. Tonsils noted to be large. No previous sleep study conducted. - Refer for sleep study     Medications Discontinued During This Encounter  Medication Reason   meloxicam  (MOBIC ) 15 MG tablet Reorder    Patient Counseling(The following topics were reviewed and/or handout was given):  -Nutrition: Stressed importance of moderation in sodium/caffeine intake, saturated fat and cholesterol,  caloric balance, sufficient intake of fresh fruits, vegetables, and fiber.  -Stressed the importance of regular exercise.   -Substance Abuse: Discussed cessation/primary prevention of tobacco, alcohol, or other drug use; driving or other dangerous activities under the influence; availability of treatment for abuse.   -Injury prevention: Discussed safety belts, safety helmets, smoke detector, smoking near bedding or upholstery.   -Sexuality: Discussed sexually transmitted diseases, partner selection, use of condoms, avoidance of unintended pregnancy and contraceptive alternatives.   -Dental health: Discussed importance of regular tooth brushing, flossing, and dental visits.  -Health maintenance and immunizations reviewed. Please refer to Health maintenance section.  Return in about 1 year (around 06/12/2025) for physical (fasting labs).        Subjective:   Encounter date: 06/12/2024  Chief Complaint  Patient presents with   Annual Exam    Pt is fasting today  HM due- HPV and Hep B vaccine   Elbow Injury    Pt stated right arm and elbow discomfort have improved; symptoms will occur while bowling; currently using Mobic  15MG  with Elastic bandage for support.    Discussed the use of AI scribe software for clinical note transcription with the patient, who gave verbal consent to proceed.  History of Present Illness Kirk James is a 38 year old male who presents for an annual physical exam.  He has a history of obesity with a BMI of 32 and hypertriglyceridemia. Last year, triglyceride levels were mildly elevated at 188 mg/dL, which is in the borderline high range. Blood sugar levels were normal, and there was no evidence of anemia, renal dysfunction, or thyroid issues at that time.  He recently experienced lateral epicondylitis of the right elbow, primarily occurring when bowling. He uses meloxicam  50 mg and an elastic bandage support brace, which have been  effective in improving his  pain. He recently resumed bowling and noted that the pain was not as severe as before.  In 2023, an ultrasound revealed an echogenic liver, likely related to fatty liver disease.  No chest pain, shortness of breath, abdominal discomfort, or leg swelling. He wakes up frequently during the night and has been told by others that he snores. He occasionally wakes up gasping for air, which he previously attributed to acid reflux. He has not undergone a sleep study and does not use a CPAP machine.  He is physically active, regularly participating in bowling, and plans to go golfing.       06/12/2024    1:50 PM 02/24/2024    4:02 PM 06/10/2023    1:27 PM  Depression screen PHQ 2/9  Decreased Interest 0 0 0  Down, Depressed, Hopeless 0 0 0  PHQ - 2 Score 0 0 0  Altered sleeping 3  3  Tired, decreased energy 0  0  Change in appetite 0  0  Feeling bad or failure about yourself  0  0  Trouble concentrating 0  0  Moving slowly or fidgety/restless 0  0  Suicidal thoughts 0  0  PHQ-9 Score 3  3  Difficult doing work/chores Not difficult at all  Not difficult at all       06/12/2024    1:50 PM 06/10/2023    1:27 PM  GAD 7 : Generalized Anxiety Score  Nervous, Anxious, on Edge 0 0  Control/stop worrying 0 0  Worry too much - different things 0 0  Trouble relaxing 0 0  Restless 0 0  Easily annoyed or irritable 0 0  Afraid - awful might happen 0 0  Total GAD 7 Score 0 0  Anxiety Difficulty Not difficult at all Not difficult at all    Health Maintenance Due  Topic Date Due   COVID-19 Vaccine (1 - 2024-25 season) Never done   INFLUENZA VACCINE  05/15/2024   HPV VACCINES (2 - 3-dose SCDM series) 07/10/2024      PMH:  The following were reviewed and entered/updated in epic: Past Medical History:  Diagnosis Date   Allergy    Anemia    Gastritis     Patient Active Problem List   Diagnosis Date Noted   Metabolic dysfunction-associated steatotic liver disease (MASLD) 06/10/2023    Gastritis 03/26/2022   Common bile duct dilatation 11/16/2021    Past Surgical History:  Procedure Laterality Date   ADENOIDECTOMY Bilateral    VASECTOMY  08/2022    History reviewed. No pertinent family history.  Medications- reviewed and updated Outpatient Medications Prior to Visit  Medication Sig Dispense Refill   Elastic Bandages & Supports (ELBOW BRACE) MISC Wear as needed 1 each 0   mometasone (ELOCON) 0.1 % ointment Apply topically.     meloxicam  (MOBIC ) 15 MG tablet Take 1 tablet (15 mg total) by mouth daily. 30 tablet 0   No facility-administered medications prior to visit.    No Known Allergies  Social History   Socioeconomic History   Marital status: Married    Spouse name: Not on file   Number of children: Not on file   Years of education: Not on file   Highest education level: Not on file  Occupational History   Occupation: [employer]  Tobacco Use   Smoking status: Never    Passive exposure: Never   Smokeless tobacco: Never  Vaping Use   Vaping status: Never Used  Substance  and Sexual Activity   Alcohol use: Yes    Alcohol/week: 1.0 standard drink of alcohol    Types: 1 Cans of beer per week   Drug use: Never   Sexual activity: Yes  Other Topics Concern   Not on file  Social History Narrative   Spouse: Toribio   Married 3 years, in 2023   Daugher 39 yo, Photographer    Social Drivers of Corporate investment banker Strain: Not on [employer] Insecurity: Not on file  Transportation Needs: Not on file  Physical Activity: Not on file  Stress: Not on file  Social Connections: Not on file           Objective:  Physical Exam: BP 116/73 (BP Location: Left Arm, Patient Position: Sitting, Cuff Size: Large)   Pulse 63   Temp (!) 97.2 F (36.2 C) (Temporal)   Resp 18   Wt 223 lb 3.2 oz (101.2 kg)   SpO2 97%   BMI 32.96 kg/m   Body mass index is 32.96 kg/m. Wt Readings from Last 3 Encounters:  06/12/24 223 lb 3.2 oz (101.2 kg)  02/24/24  226 lb 9.6 oz (102.8 kg)  06/10/23 220 lb (99.8 kg)    Physical Exam MEASUREMENTS: BMI- 32.0. GENERAL: Alert, cooperative, well developed, no acute distress. HEENT: Normocephalic, normal oropharynx, moist mucous membranes, tonsils enlarged. CHEST: Clear to auscultation bilaterally, no wheezes, rhonchi, or crackles. CARDIOVASCULAR: Normal heart rate and rhythm, S1 and S2 normal without murmurs. ABDOMEN: Soft, non-tender, non-distended, without organomegaly, normal bowel sounds. EXTREMITIES: No cyanosis or edema. NEUROLOGICAL: Cranial nerves grossly intact, moves all extremities without gross motor or sensory deficit.  Physical Exam Constitutional:      General: He is not in acute distress.    Appearance: Normal appearance. He is not ill-appearing or toxic-appearing.  HENT:     Head: Normocephalic and atraumatic.     Right Ear: Hearing, tympanic membrane, ear canal and external ear normal. There is no impacted cerumen.     Left Ear: Hearing, tympanic membrane, ear canal and external ear normal. There is no impacted cerumen.     Nose: Nose normal. No congestion.     Mouth/Throat:     Lips: No lesions.     Mouth: Mucous membranes are moist.     Pharynx: Oropharynx is clear. No oropharyngeal exudate.  Eyes:     General: No scleral icterus.       Right eye: No discharge.        Left eye: No discharge.     Conjunctiva/sclera: Conjunctivae normal.     Pupils: Pupils are equal, round, and reactive to light.  Neck:     Thyroid: No thyroid mass, thyromegaly or thyroid tenderness.  Cardiovascular:     Rate and Rhythm: Normal rate and regular rhythm.     Pulses: Normal pulses.     Heart sounds: Normal heart sounds.  Pulmonary:     Effort: Pulmonary effort is normal. No respiratory distress.     Breath sounds: Normal breath sounds.  Abdominal:     General: Abdomen is flat. Bowel sounds are normal.     Palpations: Abdomen is soft.  Musculoskeletal:        General: Normal range of  motion.     Right elbow: Normal. No swelling. Normal range of motion. No tenderness.     Left elbow: Normal. No swelling. Normal range of motion. No tenderness.     Cervical back: Normal range of motion.  Right lower leg: No edema.     Left lower leg: No edema.  Lymphadenopathy:     Cervical: No cervical adenopathy.  Skin:    General: Skin is warm and dry.     Findings: No rash.  Neurological:     General: No focal deficit present.     Mental Status: He is alert and oriented to person, place, and time. Mental status is at baseline.     Deep Tendon Reflexes:     Reflex Scores:      Patellar reflexes are 2+ on the right side and 2+ on the left side. Psychiatric:        Mood and Affect: Mood normal.        Behavior: Behavior normal.        Thought Content: Thought content normal.        Judgment: Judgment normal.         Prior labs:   No results found for this or any previous visit (from the past 2160 hours).  Lab Results  Component Value Date   CHOL 161 06/10/2023   Lab Results  Component Value Date   HDL 39.30 06/10/2023   Lab Results  Component Value Date   LDLCALC 84 06/10/2023   Lab Results  Component Value Date   TRIG 188.0 (H) 06/10/2023   Lab Results  Component Value Date   CHOLHDL 4 06/10/2023   No results found for: LDLDIRECT  Last metabolic panel Lab Results  Component Value Date   GLUCOSE 106 (H) 06/10/2023   NA 139 06/10/2023   K 3.7 06/10/2023   CL 103 06/10/2023   CO2 27 06/10/2023   BUN 14 06/10/2023   CREATININE 0.94 06/10/2023   GFR 102.87 06/10/2023   CALCIUM 9.4 06/10/2023   PROT 7.0 06/10/2023   ALBUMIN 4.3 06/10/2023   BILITOT 1.3 (H) 06/10/2023   ALKPHOS 60 06/10/2023   AST 14 06/10/2023   ALT 23 06/10/2023    Lab Results  Component Value Date   HGBA1C 5.6 06/10/2023    Last CBC Lab Results  Component Value Date   WBC 8.3 06/10/2023   HGB 15.5 06/10/2023   HCT 46.1 06/10/2023   MCV 84.5 06/10/2023   RDW 12.8  06/10/2023   PLT 216.0 06/10/2023    Lab Results  Component Value Date   TSH 0.67 06/10/2023    No results found for: PSA1, PSA  Last vitamin D No results found for: MARIEN BOLLS, VD25OH  Lab Results  Component Value Date   PROTEINUR NEGATIVE 06/10/2023    No results found for: LABMICR, MICROALBUR   At today's visit, we discussed treatment options, associated risk and benefits, and engage in counseling as needed.  Additionally the following were reviewed: Past medical records, past medical and surgical history, family and social background, as well as relevant laboratory results, imaging findings, and specialty notes, where applicable.  This message was generated using dictation software, and as a result, it may contain unintentional typos or errors.  Nevertheless, extensive effort was made to accurately convey at the pertinent aspects of the patient visit.    There may have been are other unrelated non-urgent complaints, but due to the busy schedule and the amount of time already spent with him, time does not permit to address these issues at today's visit. Another appointment may have or has been requested to review these additional issues.     Arvella Hummer, MD, MS

## 2024-06-13 LAB — URINALYSIS W MICROSCOPIC + REFLEX CULTURE
Bacteria, UA: NONE SEEN /HPF
Bilirubin Urine: NEGATIVE
Glucose, UA: NEGATIVE
Hgb urine dipstick: NEGATIVE
Hyaline Cast: NONE SEEN /LPF
Ketones, ur: NEGATIVE
Leukocyte Esterase: NEGATIVE
Nitrites, Initial: NEGATIVE
Protein, ur: NEGATIVE
RBC / HPF: NONE SEEN /HPF (ref 0–2)
Specific Gravity, Urine: 1.027 (ref 1.001–1.035)
Squamous Epithelial / HPF: NONE SEEN /HPF (ref <=5)
WBC, UA: NONE SEEN /HPF (ref 0–5)
pH: 6.5 (ref 5.0–8.0)

## 2024-06-13 LAB — NO CULTURE INDICATED

## 2024-06-23 ENCOUNTER — Ambulatory Visit: Payer: Self-pay | Admitting: Family Medicine

## 2024-06-26 ENCOUNTER — Other Ambulatory Visit (INDEPENDENT_AMBULATORY_CARE_PROVIDER_SITE_OTHER)

## 2024-06-27 LAB — BILIRUBIN, FRACTIONATED(TOT/DIR/INDIR)
Bilirubin, Direct: 0.1 mg/dL (ref 0.0–0.2)
Indirect Bilirubin: 0.5 mg/dL (ref 0.2–1.2)
Total Bilirubin: 0.6 mg/dL (ref 0.2–1.2)

## 2024-07-03 ENCOUNTER — Ambulatory Visit: Payer: Self-pay | Admitting: Family Medicine

## 2024-08-13 ENCOUNTER — Ambulatory Visit (INDEPENDENT_AMBULATORY_CARE_PROVIDER_SITE_OTHER): Payer: Self-pay

## 2024-08-13 DIAGNOSIS — Z23 Encounter for immunization: Secondary | ICD-10-CM | POA: Diagnosis not present

## 2024-08-13 NOTE — Progress Notes (Signed)
 Per orders of Dr Sebastian, injection of Hep B given  in the RT deltoid by Karna Christ, cma.  Patient tolerated injection well. Dm/cma

## 2025-06-15 ENCOUNTER — Encounter: Admitting: Family Medicine
